# Patient Record
Sex: Female | Born: 1975 | Race: White | Hispanic: No | Marital: Married | Smoking: Never smoker
Health system: Southern US, Community
[De-identification: ages and names within clinical notes are randomized; demographics above are authoritative.]

## PROBLEM LIST (undated history)

## (undated) DIAGNOSIS — E7800 Pure hypercholesterolemia, unspecified: Secondary | ICD-10-CM

---

## 1999-08-07 ENCOUNTER — Ambulatory Visit (HOSPITAL_COMMUNITY): Admission: RE | Admit: 1999-08-07 | Discharge: 1999-08-07 | Payer: Self-pay | Admitting: Obstetrics and Gynecology

## 1999-08-15 ENCOUNTER — Observation Stay (HOSPITAL_COMMUNITY): Admission: AD | Admit: 1999-08-15 | Discharge: 1999-08-16 | Payer: Self-pay | Admitting: Obstetrics and Gynecology

## 1999-09-20 ENCOUNTER — Inpatient Hospital Stay (HOSPITAL_COMMUNITY): Admission: AD | Admit: 1999-09-20 | Discharge: 1999-09-20 | Payer: Self-pay | Admitting: Obstetrics and Gynecology

## 1999-11-14 ENCOUNTER — Inpatient Hospital Stay (HOSPITAL_COMMUNITY): Admission: AD | Admit: 1999-11-14 | Discharge: 1999-11-14 | Payer: Self-pay | Admitting: Obstetrics and Gynecology

## 1999-12-04 ENCOUNTER — Inpatient Hospital Stay (HOSPITAL_COMMUNITY): Admission: AD | Admit: 1999-12-04 | Discharge: 1999-12-04 | Payer: Self-pay | Admitting: Obstetrics and Gynecology

## 1999-12-11 ENCOUNTER — Inpatient Hospital Stay (HOSPITAL_COMMUNITY): Admission: AD | Admit: 1999-12-11 | Discharge: 1999-12-11 | Payer: Self-pay | Admitting: Obstetrics & Gynecology

## 1999-12-16 ENCOUNTER — Inpatient Hospital Stay (HOSPITAL_COMMUNITY): Admission: AD | Admit: 1999-12-16 | Discharge: 1999-12-19 | Payer: Self-pay | Admitting: Obstetrics and Gynecology

## 1999-12-21 ENCOUNTER — Encounter: Admission: RE | Admit: 1999-12-21 | Discharge: 2000-03-20 | Payer: Self-pay | Admitting: Obstetrics and Gynecology

## 2000-01-22 ENCOUNTER — Other Ambulatory Visit: Admission: RE | Admit: 2000-01-22 | Discharge: 2000-01-22 | Payer: Self-pay | Admitting: Obstetrics and Gynecology

## 2000-12-05 ENCOUNTER — Other Ambulatory Visit: Admission: RE | Admit: 2000-12-05 | Discharge: 2000-12-05 | Payer: Self-pay | Admitting: Obstetrics and Gynecology

## 2001-06-23 ENCOUNTER — Encounter: Payer: Self-pay | Admitting: Obstetrics and Gynecology

## 2001-06-23 ENCOUNTER — Ambulatory Visit (HOSPITAL_COMMUNITY): Admission: RE | Admit: 2001-06-23 | Discharge: 2001-06-23 | Payer: Self-pay | Admitting: Obstetrics and Gynecology

## 2001-07-19 ENCOUNTER — Encounter (INDEPENDENT_AMBULATORY_CARE_PROVIDER_SITE_OTHER): Payer: Self-pay

## 2001-07-19 ENCOUNTER — Inpatient Hospital Stay (HOSPITAL_COMMUNITY): Admission: AD | Admit: 2001-07-19 | Discharge: 2001-07-21 | Payer: Self-pay | Admitting: Obstetrics & Gynecology

## 2001-11-27 ENCOUNTER — Encounter: Payer: Self-pay | Admitting: Obstetrics and Gynecology

## 2001-11-27 ENCOUNTER — Ambulatory Visit (HOSPITAL_COMMUNITY): Admission: RE | Admit: 2001-11-27 | Discharge: 2001-11-27 | Payer: Self-pay | Admitting: Obstetrics and Gynecology

## 2004-09-22 ENCOUNTER — Other Ambulatory Visit: Admission: RE | Admit: 2004-09-22 | Discharge: 2004-09-22 | Payer: Self-pay | Admitting: Obstetrics and Gynecology

## 2005-01-09 ENCOUNTER — Ambulatory Visit: Payer: Self-pay | Admitting: *Deleted

## 2005-01-24 ENCOUNTER — Ambulatory Visit: Payer: Self-pay

## 2005-01-30 ENCOUNTER — Ambulatory Visit: Payer: Self-pay | Admitting: *Deleted

## 2005-04-26 ENCOUNTER — Ambulatory Visit: Payer: Self-pay | Admitting: *Deleted

## 2005-05-10 ENCOUNTER — Ambulatory Visit: Payer: Self-pay | Admitting: *Deleted

## 2005-07-10 ENCOUNTER — Ambulatory Visit: Payer: Self-pay | Admitting: *Deleted

## 2008-08-29 ENCOUNTER — Inpatient Hospital Stay (HOSPITAL_COMMUNITY): Admission: AD | Admit: 2008-08-29 | Discharge: 2008-08-29 | Payer: Self-pay | Admitting: Obstetrics and Gynecology

## 2010-07-25 LAB — URINALYSIS, ROUTINE W REFLEX MICROSCOPIC
Glucose, UA: 250 mg/dL — AB
Ketones, ur: 15 mg/dL — AB
Nitrite: POSITIVE — AB
Protein, ur: 100 mg/dL — AB
Specific Gravity, Urine: <1.005 — ABNORMAL LOW (ref 1.005–1.030)
Urobilinogen, UA: >8 mg/dL — ABNORMAL HIGH (ref 0.0–1.0)
pH: 5 (ref 5.0–8.0)

## 2010-07-25 LAB — URINE MICROSCOPIC-ADD ON

## 2010-09-01 NOTE — Op Note (Signed)
[facility] of [facility]  Patient:    Tiffany Summers, Tiffany Summers Visit Number: [id number] MRN: [id number]          Service Type: OBS Location: 910A 9144 01 Attending Physician:  Lars Pinks Dictated by:   Caralyn Guile Arlyce Dice, M.D. Proc. Date: 07/20/01 Admit Date:  07/19/2001                             Operative Report  PREOPERATIVE DIAGNOSES:       Voluntary sterilization.  POSTOPERATIVE DIAGNOSES:      Voluntary sterilization.  PROCEDURE:                    Bilateral partial salpingectomy for sterilization.  SURGEON:                      Richard D. Arlyce Dice, M.D.  ANESTHESIA:                   Epidural.  ESTIMATED BLOOD LOSS:         5 cc.  FINDINGS:                     Normal appearing fallopian tubes.  INDICATIONS:                  This is a 35 year old gravida 2, para 2 who is less than 24 hours post vaginal delivery who requests tubal ligation.  The failure rate of 2-5 per 1000, the fact that the procedure is permanent, and the fact that there are alternative nonpermanent forms of birth control were discussed with the patient prior to proceeding.  PROCEDURE:                    The patient was taken to the operating room where an epidural catheter that had been placed for labor was reinjected for surgical anesthesia.  The abdomen was prepped and draped in a sterile fashion. A subumbilical incision was made and carried down to the fascia by sharp and blunt dissection.  The incision was then extended into the peritoneum by sharp and blunt dissection.  The right fallopian tube was identified, grasped with a Babcock clamp, and traced to the fimbriated end.  The isthmic ampullary junction was then identified and a knuckle of tube was ligated and excised for pathological confirmation.  The identical procedure was then carried out on the contralateral tube.  The fascia was closed with a matris suture and the skin was closed with subcuticular 4-0 Dexon  suture.  The patient tolerated procedure well and left the operating room in good condition. Dictated by:   Caralyn Guile Arlyce Dice, M.D. Attending Physician:  Lars Pinks DD:  07/20/01 TD:  07/21/01 Job: [id number]

## 2010-09-01 NOTE — Discharge Summary (Signed)
[facility]  Patient:    Tiffany Summers, Tiffany Summers Visit Number: [id number] MRN: [id number]          Service Type: OBS Location: 910A 9144 01 Attending Physician:  Lars Pinks Dictated by:   Gerrit Friends. Aldona Bar, M.D. Admit Date:  07/19/2001 Disc. Date: 07/21/01                             Discharge Summary  DISCHARGE DIAGNOSES:          1. Term pregnancy, delivered a 7 pound                                  7 ounce female infant, Apgars 9 and 9.                               2. Blood type O+.                               3. Desire for permanent elective sterilization.  PROCEDURES:                   1. Normal spontaneous delivery.                               2. Second-degree tear and repair.                               3. Postpartum tubal sterilization.  HISTORY:                      This 35 year old gravida 2, para 1 with a due date of April 15 was admitted in labor after an uncomplicated pregnancy. Membranes were ruptured at the time of presentation as well.  She was admitted, placed on intravenous Pitocin, and subsequently had a good labor pattern with a subsequent normal spontaneous delivery of a viable female infant with Apgars of 9 and 9 over a second-degree tear, which was repaired without difficulty.  Baby weight was 7 pounds 7 ounces.  The patient did request a permanent elective sterilization procedure and, according to her wishes, was taken to the operating room on the morning of April 6 for such procedure.  On the morning of April 7, she was ambulating well, tolerating a regular diet well, was having normal bowel and bladder function, was afebrile, was breast feeding without difficulty.  Vital signs were stable.  She was afebrile and she was deemed ready for discharge. Accordingly, she was given office visit instructions per discharge brochure at the time of discharge.  DISCHARGE MEDICATIONS:        1. Vitamins 1 a day.                 2. Ferrous sulfate 300 mg a day.                               3. Motrin 600 mg every six hours as needed for  pain.                               4. Tylox 1-2 every 4-6 hours as needed for                                  severe pain.  LABORATORY DATA:              Discharge hemoglobin was 10.1 with a white count of 13,500 and a platelet count of 168,000.  FOLLOWUP:                     The patient will return to the office for followup in approximately four weekss time or as needed.  CONDITION ON DISCHARGE:       Improved. Dictated by:   Gerrit Friends. Aldona Bar, M.D. Attending Physician:  Lars Pinks DD:  07/21/01 TD:  07/21/01 Job: [id number]

## 2010-09-01 NOTE — Discharge Summary (Signed)
[facility] of [facility]  Patient:    Tiffany Summers, Tiffany Summers                      MRN: [id number] Adm. Date:  [id number] Disc. Date: [id number] Attending:  Conley Simmonds A Dictator:   Leilani Able, P.A.                           Discharge Summary  FINAL DIAGNOSES:              1. Intrauterine pregnancy at 40 and 2/[redacted] weeks                                  gestation.                               2. Nonreassuring fetal assessment.                               3. Maternal exhaustion.  PROCEDURES:                   1. Attempted vacuum extraction vaginal delivery.                               2. Outlet forceps delivery and repair of a fourth                                  degree laceration.  SURGEON:                      Dr. Conley Simmonds.  COMPLICATIONS:                Fourth degree extension of her midline episiotomy.  ADMISSION HISTORY:            This 35 year old G1, P0 presented at 42 and 2/[redacted] weeks gestation with contractions every five to seven minutes.  HOSPITAL COURSE:              The patients cervix upon admission was about 1-2 m dilated, 80% effaced, and -2 station.  The patient had AROM performed at this time with clear fluid, and fetal heart rate was reassuring at this time.  The patients blood pressures started rising at this time and ranged up to about 140/94 at the maximum.  Preeclampsia labs were checked and all noted to be normal.  The patient received Stadol IV and then received an epidural.  The patient progressed rapidly to complete.  At this point there were some mild to moderate variable accelerations noted on the fetal heart tracing.  Fetal scalp electrodes were placed at this time. The patient continued to develop some moderate variable accelerations while pushing.  At this point it was felt that we needed to proceed with a vacuum extraction delivery to shorten the second stage of labor.  At this point the Mityvac was applied and the  vertex was brought down to a +3 station.  At this point an episiotomy had been cut, the suction was failing on the Mighty Vac.  At this  point a decision was made to proceed with forceps delivery  as the patient was unable to spontaneously deliver the vertex.  At this point Simpson forceps were  placed without difficulty, and the patient delivered with one contraction. There was a nuchal cord x 1 noted.  The patient had the delivery of a 7 pound 13 ounce female infant with Apgars and 8 and 8.  There was a fourth degree extension of her episiotomy that was repaired.  The patient tolerated the procedure well.  The patients postpartum course was complicated by some low grade temperatures which  eventually resolved.  The patient was felt ready for discharge on postpartum day #2.  DISCHARGE INSTRUCTIONS:       The patient was sent home on a regular diet, told to decrease activity.  DISCHARGE MEDICATIONS:        Told to continue prenatal vitamin, told to continue stool softeners if needed, was given Tylox one to two q.4h. as needed for pain.  DISCHARGE FOLLOWUP:           Told to follow up in the office in four to six weeks. DD:  01/10/00 TD:  01/10/00 Job: [id number]

## 2010-09-01 NOTE — Op Note (Signed)
[facility]  Patient:    Tiffany Summers, Tiffany Summers                      MRN: [id number] Proc. Date: 12/17/99 Adm. Date:  [id number] Attending:  Conley Simmonds A                           Operative Report  PREOPERATIVE DIAGNOSES:       1. Intrauterine gestation at 40+ 2 weeks.                               2. Nonreassuring fetal assessment.                               3. Maternal exhaustion.  POSTOPERATIVE DIAGNOSES:      1. Intrauterine gestation at 40+ 2 weeks.                               2. Nonreassuring fetal assessment.                               3. Maternal exhaustion.  OPERATION/PROCEDURE:          1. Attempted vacuum extraction vaginal delivery.                               2. Episiotomy.                               3. Outlet forceps delivery.                               4. Repair of fourth degree laceration.  SURGEON:                      Brook A. Edward Jolly, M.D.  ANESTHESIA:                   Epidural, Duramorph epidural.  ESTIMATED BLOOD LOSS:         Less than 500 cc.  COMPLICATIONS:                Fourth degree extension of midline episiotomy.  INDICATIONS FOR PROCEDURE:    The patient is a 35 year old gravida 1 para 0 female at 11+ [redacted] weeks gestation when she presented on December 16, 1999 complaining of contractions every five to seven minutes.  The patient was noted to have cervical dilatation of 1-2 cm, 80% effacement, with vertex at -2 station.  The patient was noted to be in latent phase labor at this time, and artificial rupture of membranes was performed, with clear fluid noted.  The fetus had reassuring fetal assessment at this time.                                Upon presentation, the patient had blood pressures which ranged up to 140/94 at maximum.  Preeclampsia laboratories were all checked and were noted to be negative.  The patient progressed through her latent phase labor, and she received  Stadol intravenously for anesthesia.  She then received an epidural, and she rapidly progressed to complete cervical dilatation with vertex at the +1 station.  The patient had been experiencing mild to moderate variable fetal heart rate decelerations, and a fetal scalp electrode was placed.  The patient continued to develop moderate variable fetal heart rate decelerations while pushing, and these progressed to severe variable decelerations.  The patient had decreased maternal effort secondary to her epidural anesthesia, and recommendation was made by the obstetrician to therefore proceed with vacuum extraction delivery.  DESCRIPTION OF PROCEDURE:     The patients bladder was emptied of all urine, and the fetal vertex was confirmed to be in occiput anterior position at the +2 station.  The Mityvac was applied to the fetal vertex, and the vertex was brought down to a +3 station.  At this point an episiotomy had been cut.  The suction was failing on the Mityvac, and the decision was therefore made to proceed with forceps delivery as the patient was not able to spontaneously deliver the vertex.  Simpson forceps were placed without difficulty, and the vertex was delivered with one contraction.  A nuchal cord x 1 was reduced.                                A vigorous female infant was born at 41 and assigned Apgar scores of 8 at one minute and 9 at five minutes.  Shortly after delivery the newborn developed retractions and was taken to the newborn nursery after already having received blow-by oxygen, bulb syringe suctioning, and removal of mucus.                                The midline episiotomy was noted to extend into a fourth degree laceration.  The placenta was next delivered spontaneously, and it was noted to have a normal insertion of a three vessel cord.  The cervix and vagina were without any lacerations.  The fourth degree laceration was repaired in standard fashion using a  combination of running suture of 3-0 chromic at the mucosal level.  This was supported with a second layer of running 2-0 Vicryl placed over this.  The rectal sphincter was repaired in standard fashion using 0 Vicryl figure-of-eight sutures, and the vaginal incision was closed with 2-0 Vicryl, all under the assistance of a Duramorph epidural.  A Foley catheter was placed at the end of the procedure.  The cord pH was noted to be 7.16.                                The patient tolerated the procedure well.  She received Pitocin 20 units intravenously after delivery of the placenta.  All needle, instrument, and sponge counts were reported as correct. DD:  12/17/99 TD:  12/19/99 Job: [id number]

## 2015-09-28 DIAGNOSIS — M545 Low back pain: Secondary | ICD-10-CM | POA: Diagnosis not present

## 2015-09-28 MED FILL — predniSONE 10 MG (21) TBPK: 10 | 6 days supply | Qty: 21 | Fill #0

## 2016-07-18 DIAGNOSIS — H5213 Myopia, bilateral: Secondary | ICD-10-CM | POA: Diagnosis not present

## 2016-07-18 DIAGNOSIS — H52223 Regular astigmatism, bilateral: Secondary | ICD-10-CM | POA: Diagnosis not present

## 2016-07-31 DIAGNOSIS — Z1231 Encounter for screening mammogram for malignant neoplasm of breast: Secondary | ICD-10-CM | POA: Diagnosis not present

## 2016-07-31 DIAGNOSIS — Z124 Encounter for screening for malignant neoplasm of cervix: Secondary | ICD-10-CM | POA: Diagnosis not present

## 2016-07-31 DIAGNOSIS — Z1151 Encounter for screening for human papillomavirus (HPV): Secondary | ICD-10-CM | POA: Diagnosis not present

## 2016-07-31 DIAGNOSIS — Z1389 Encounter for screening for other disorder: Secondary | ICD-10-CM | POA: Diagnosis not present

## 2016-07-31 DIAGNOSIS — R319 Hematuria, unspecified: Secondary | ICD-10-CM | POA: Diagnosis not present

## 2016-07-31 DIAGNOSIS — Z01419 Encounter for gynecological examination (general) (routine) without abnormal findings: Secondary | ICD-10-CM | POA: Diagnosis not present

## 2016-07-31 DIAGNOSIS — Z6825 Body mass index (BMI) 25.0-25.9, adult: Secondary | ICD-10-CM | POA: Diagnosis not present

## 2016-07-31 DIAGNOSIS — L720 Epidermal cyst: Secondary | ICD-10-CM | POA: Diagnosis not present

## 2016-08-03 DIAGNOSIS — Z0000 Encounter for general adult medical examination without abnormal findings: Secondary | ICD-10-CM | POA: Diagnosis not present

## 2016-08-03 MED FILL — SULFAMETHOXAZOLE/TMP DS TAB: 800-160 | 5 days supply | Qty: 10 | Fill #0

## 2016-10-07 ENCOUNTER — Encounter (HOSPITAL_COMMUNITY): Payer: Self-pay | Admitting: Emergency Medicine

## 2016-10-07 ENCOUNTER — Emergency Department (HOSPITAL_COMMUNITY)
Admission: EM | Admit: 2016-10-07 | Discharge: 2016-10-07 | Disposition: A | Discharge status: Home / Self Care | Payer: 59 | Attending: Emergency Medicine | Admitting: Emergency Medicine

## 2016-10-07 DIAGNOSIS — Y93H1 Activity, digging, shoveling and raking: Secondary | ICD-10-CM | POA: Insufficient documentation

## 2016-10-07 DIAGNOSIS — Y92007 Garden or yard of unspecified non-institutional (private) residence as the place of occurrence of the external cause: Secondary | ICD-10-CM | POA: Insufficient documentation

## 2016-10-07 DIAGNOSIS — Y999 Unspecified external cause status: Secondary | ICD-10-CM | POA: Diagnosis not present

## 2016-10-07 DIAGNOSIS — X503XXA Overexertion from repetitive movements, initial encounter: Secondary | ICD-10-CM | POA: Diagnosis not present

## 2016-10-07 DIAGNOSIS — S39012A Strain of muscle, fascia and tendon of lower back, initial encounter: Secondary | ICD-10-CM | POA: Diagnosis not present

## 2016-10-07 DIAGNOSIS — S3992XA Unspecified injury of lower back, initial encounter: Secondary | ICD-10-CM | POA: Diagnosis present

## 2016-10-07 HISTORY — DX: Pure hypercholesterolemia, unspecified: E78.00

## 2016-10-07 MED ORDER — CYCLOBENZAPRINE HCL 10 MG PO TABS: 10.0000 mg | ORAL_TABLET | Freq: Three times a day (TID) | ORAL | 0 refills | Status: DC | PRN

## 2016-10-07 MED ORDER — MELOXICAM 7.5 MG PO TABS
7.5000 mg | ORAL_TABLET | Freq: Every day | ORAL | 0 refills | Status: DC
Start: 2016-10-07 — End: 2022-11-01

## 2016-10-07 MED ORDER — LIDOCAINE 5 % EX PTCH: 1.0000 | MEDICATED_PATCH | CUTANEOUS | 0 refills | Status: DC

## 2016-10-07 MED ORDER — KETOROLAC TROMETHAMINE 60 MG/2ML IM SOLN
60.0000 mg | Freq: Once | INTRAMUSCULAR | Status: AC
  Administered 2016-10-07: 60 mg via INTRAMUSCULAR
  Filled 2016-10-07: qty 2

## 2016-10-07 MED ORDER — HYDROCODONE-ACETAMINOPHEN 5-325 MG PO TABS: 1.0000 | ORAL_TABLET | ORAL | 0 refills | Status: DC | PRN

## 2016-10-07 NOTE — Discharge Instructions (Signed)
Read the information below.  Use the prescribed medication as directed.  Please discuss all new medications with your pharmacist.  You may return to the Emergency Department at any time for worsening condition or any new symptoms that concern you.    If you develop fevers, loss of control of bowel or bladder, weakness or numbness in your legs, or are unable to walk, return to the ER for a recheck.  °

## 2016-10-07 NOTE — ED Triage Notes (Signed)
Pt reports lower midline back pain after raking on Friday. Ambulatory.

## 2016-10-07 NOTE — ED Provider Notes (Signed)
[facility]-EMERGENCY DEPT Provider Note   CSN: [id number] Arrival date & time: 10/07/16  1219  By signing my name below, I, Cynda AcresHailei Fulton, attest that this documentation has been prepared under the direction and in the presence of [facility] Naples[facility]ily Aurora Rody, PA-C. Electronically Signed: Cynda AcresHailei Fulton, Scribe. 10/07/16. 1:12 PM.  History   Chief Complaint Chief Complaint  Patient presents with  . Back Pain   HPI Comments: Tiffany Summers is a 41 y.o. female with a history of hyperlipidemia, who presents to the Emergency Department complaining of sudden-onset, constant lower back pain that began 2 days ago. Patient states she was raking dirt two days ago when she twisted, injuring her mid lower back with radiation to the buttocks. No additional symptoms noted. Patient reports taking Toradol, which was prescribed to her husband with some relief. Patient is ambulatory in the emergency department. Patient states her pain is worse with twisting or changing positions. Patient denies any urinary retention, dysuria, loss of bowel/bladder control, abdominal pain, or any additional symptoms.   LMP started today, on time and normal.  Denies possibility of pregnancy.    The history is provided by the patient. No language interpreter was used.    Past Medical History:  Diagnosis Date  . Hypercholesteremia     There are no active problems to display for this patient.   History reviewed. No pertinent surgical history.  OB History    No data available       Home Medications    Prior to Admission medications   Medication Sig Start Date End Date Taking? Authorizing Provider  cyclobenzaprine (FLEXERIL) 10 MG tablet Take 1 tablet (10 mg total) by mouth 3 (three) times daily as needed for muscle spasms (or pain). 10/07/16   Trixie DredgeWest, Shamina Etheridge, PA-C  HYDROcodone-acetaminophen (NORCO/VICODIN) 5-325 MG tablet Take 1 tablet by mouth every 4 (four) hours as needed for severe pain. 10/07/16   Trixie DredgeWest, Srihari Shellhammer, PA-C  lidocaine  (LIDODERM) 5 % Place 1 patch onto the skin daily. Remove & Discard patch within 12 hours or as directed by MD 10/07/16   Trixie DredgeWest, Hermine Feria, PA-C  meloxicam (MOBIC) 7.5 MG tablet Take 1-2 tablets (7.5-15 mg total) by mouth daily. 10/07/16   Trixie DredgeWest, Belen Zwahlen, PA-C    Family History History reviewed. No pertinent family history.  Social History Social History  Substance Use Topics  . Smoking status: Never Smoker  . Smokeless tobacco: Never Used  . Alcohol use No     Allergies   Patient has no known allergies.   Review of Systems Review of Systems  Constitutional: Negative for chills and fever.  Genitourinary: Negative for difficulty urinating and dysuria.  Musculoskeletal: Positive for back pain (with radiation to the buttocks). Negative for arthralgias, gait problem and joint swelling.     Physical Exam Updated Vital Signs BP 115/73 (BP Location: Left Arm)   Pulse 69   Temp 98 F (36.7 C) (Oral)   Resp 18   SpO2 100%   Physical Exam  Constitutional: She appears well-developed and well-nourished. No distress.  HENT:  Head: Normocephalic and atraumatic.  Neck: Neck supple.  Pulmonary/Chest: Effort normal.  Abdominal: Soft. She exhibits no distension and no mass. There is no tenderness. There is no rebound and no guarding.  Musculoskeletal:  Mild tenderness to the lower lumbar and right paraspinal musculature. Pain, but no radiation with straight leg raise.   Neurological: She is alert.  Skin: She is not diaphoretic.  Nursing note and vitals reviewed.    ED Treatments /  Results  DIAGNOSTIC STUDIES: Oxygen Saturation is 100% on RA, normal by my interpretation.    COORDINATION OF CARE: 1:12 PM Discussed treatment plan with pt at bedside and pt agreed to plan, which includes pain medication and imaging.   Labs (all labs ordered are listed, but only abnormal results are displayed) Labs Reviewed - No data to display  EKG  EKG Interpretation None       Radiology No  results found.  Procedures Procedures (including critical care time)  Medications Ordered in ED Medications  ketorolac (TORADOL) injection 60 mg (60 mg Intramuscular Given 10/07/16 1322)     Initial Impression / Assessment and Plan / ED Course  I have reviewed the triage vital signs and the nursing notes.  Pertinent labs & imaging results that were available during my care of the patient were reviewed by me and considered in my medical decision making (see chart for details).      Patient with back pain.  No neurological deficits and normal neuro exam.  Patient can walk but states is painful.  No loss of bowel or bladder control.  No concern for cauda equina. RICE protocol and pain medicine indicated and discussed with patient.    Afebrile, nontoxic patient with mechanical low back pain. No red flags.  D/C home with PCP follow up, symptomatic medications.  Discussed result, findings, treatment, and follow up  with patient.  Pt given return precautions.  Pt verbalizes understanding and agrees with plan.       Final Clinical Impressions(s) / ED Diagnoses   Final diagnoses:  Strain of lumbar region, initial encounter    New Prescriptions Discharge Medication List as of 10/07/2016  1:20 PM    START taking these medications   Details  cyclobenzaprine (FLEXERIL) 10 MG tablet Take 1 tablet (10 mg total) by mouth 3 (three) times daily as needed for muscle spasms (or pain)., Starting Sun 10/07/2016, Print    HYDROcodone-acetaminophen (NORCO/VICODIN) 5-325 MG tablet Take 1 tablet by mouth every 4 (four) hours as needed for severe pain., Starting Sun 10/07/2016, Print    lidocaine (LIDODERM) 5 % Place 1 patch onto the skin daily. Remove & Discard patch within 12 hours or as directed by MD, Starting Sun 10/07/2016, Print    meloxicam (MOBIC) 7.5 MG tablet Take 1-2 tablets (7.5-15 mg total) by mouth daily., Starting Sun 10/07/2016, Print      I personally performed the services described in  this documentation, which was scribed in my presence. The recorded information has been reviewed and is accurate.     Trixie Dredge, PA-C 10/07/16 1632    Arby Barrette, MD 10/26/16 [phone]

## 2016-10-08 DIAGNOSIS — M5442 Lumbago with sciatica, left side: Secondary | ICD-10-CM | POA: Diagnosis not present

## 2016-10-11 ENCOUNTER — Ambulatory Visit
Admission: RE | Admit: 2016-10-11 | Discharge: 2016-10-11 | Disposition: A | Discharge status: Home / Self Care | Payer: 59 | Source: Ambulatory Visit | Attending: Family Medicine | Admitting: Family Medicine

## 2016-10-11 ENCOUNTER — Other Ambulatory Visit: Payer: Self-pay | Admitting: Family Medicine

## 2016-10-11 DIAGNOSIS — M545 Low back pain: Secondary | ICD-10-CM | POA: Diagnosis not present

## 2016-10-11 DIAGNOSIS — M5442 Lumbago with sciatica, left side: Secondary | ICD-10-CM

## 2016-10-19 DIAGNOSIS — M5442 Lumbago with sciatica, left side: Secondary | ICD-10-CM | POA: Diagnosis not present

## 2016-10-19 MED FILL — CYCLOBENZAPRINE 10 MG TABLE: 10 | 5 days supply | Qty: 15 | Fill #0

## 2016-10-19 MED FILL — HYDROCODON-APAP 5-325: 5-325 | 2 days supply | Qty: 8 | Fill #0

## 2016-10-19 MED FILL — NAPROXEN 500 MG TABLET: 500 | 15 days supply | Qty: 30 | Fill #0

## 2016-10-22 ENCOUNTER — Other Ambulatory Visit: Payer: Self-pay | Admitting: Family Medicine

## 2016-10-22 DIAGNOSIS — R29898 Other symptoms and signs involving the musculoskeletal system: Secondary | ICD-10-CM

## 2016-10-22 DIAGNOSIS — M5442 Lumbago with sciatica, left side: Secondary | ICD-10-CM

## 2016-10-22 DIAGNOSIS — R200 Anesthesia of skin: Secondary | ICD-10-CM

## 2016-10-26 ENCOUNTER — Other Ambulatory Visit (HOSPITAL_COMMUNITY): Payer: Self-pay | Admitting: Family Medicine

## 2016-10-26 ENCOUNTER — Other Ambulatory Visit: Payer: Self-pay | Admitting: Family Medicine

## 2016-10-26 DIAGNOSIS — M5442 Lumbago with sciatica, left side: Secondary | ICD-10-CM

## 2016-10-26 DIAGNOSIS — R200 Anesthesia of skin: Secondary | ICD-10-CM

## 2016-10-26 DIAGNOSIS — R29898 Other symptoms and signs involving the musculoskeletal system: Secondary | ICD-10-CM

## 2016-10-27 ENCOUNTER — Ambulatory Visit (HOSPITAL_COMMUNITY)
Admission: RE | Admit: 2016-10-27 | Discharge: 2016-10-27 | Disposition: A | Discharge status: Home / Self Care | Payer: 59 | Source: Ambulatory Visit | Attending: Family Medicine | Admitting: Family Medicine

## 2016-10-27 DIAGNOSIS — M545 Low back pain: Secondary | ICD-10-CM | POA: Diagnosis present

## 2016-10-27 DIAGNOSIS — M48061 Spinal stenosis, lumbar region without neurogenic claudication: Secondary | ICD-10-CM | POA: Diagnosis not present

## 2016-10-27 DIAGNOSIS — M5126 Other intervertebral disc displacement, lumbar region: Secondary | ICD-10-CM | POA: Diagnosis not present

## 2016-10-27 DIAGNOSIS — R29898 Other symptoms and signs involving the musculoskeletal system: Secondary | ICD-10-CM

## 2016-10-27 DIAGNOSIS — K5730 Diverticulosis of large intestine without perforation or abscess without bleeding: Secondary | ICD-10-CM | POA: Insufficient documentation

## 2016-10-27 DIAGNOSIS — R531 Weakness: Secondary | ICD-10-CM | POA: Insufficient documentation

## 2016-10-27 DIAGNOSIS — M5442 Lumbago with sciatica, left side: Secondary | ICD-10-CM

## 2016-10-27 DIAGNOSIS — R200 Anesthesia of skin: Secondary | ICD-10-CM | POA: Insufficient documentation

## 2016-10-27 DIAGNOSIS — M47816 Spondylosis without myelopathy or radiculopathy, lumbar region: Secondary | ICD-10-CM | POA: Diagnosis not present

## 2016-10-27 DIAGNOSIS — M5136 Other intervertebral disc degeneration, lumbar region: Secondary | ICD-10-CM | POA: Diagnosis not present

## 2017-09-18 DIAGNOSIS — Z6826 Body mass index (BMI) 26.0-26.9, adult: Secondary | ICD-10-CM | POA: Diagnosis not present

## 2017-09-18 DIAGNOSIS — Z1231 Encounter for screening mammogram for malignant neoplasm of breast: Secondary | ICD-10-CM | POA: Diagnosis not present

## 2017-09-18 DIAGNOSIS — Z130 Encounter for screening for diseases of the blood and blood-forming organs and certain disorders involving the immune mechanism: Secondary | ICD-10-CM | POA: Diagnosis not present

## 2017-09-18 DIAGNOSIS — Z1389 Encounter for screening for other disorder: Secondary | ICD-10-CM | POA: Diagnosis not present

## 2017-09-18 DIAGNOSIS — Z01419 Encounter for gynecological examination (general) (routine) without abnormal findings: Secondary | ICD-10-CM | POA: Diagnosis not present

## 2017-09-18 DIAGNOSIS — Z124 Encounter for screening for malignant neoplasm of cervix: Secondary | ICD-10-CM | POA: Diagnosis not present

## 2018-12-09 DIAGNOSIS — Z1389 Encounter for screening for other disorder: Secondary | ICD-10-CM | POA: Diagnosis not present

## 2018-12-09 DIAGNOSIS — Z130 Encounter for screening for diseases of the blood and blood-forming organs and certain disorders involving the immune mechanism: Secondary | ICD-10-CM | POA: Diagnosis not present

## 2018-12-09 DIAGNOSIS — Z01419 Encounter for gynecological examination (general) (routine) without abnormal findings: Secondary | ICD-10-CM | POA: Diagnosis not present

## 2018-12-09 DIAGNOSIS — Z6823 Body mass index (BMI) 23.0-23.9, adult: Secondary | ICD-10-CM | POA: Diagnosis not present

## 2018-12-09 DIAGNOSIS — Z0000 Encounter for general adult medical examination without abnormal findings: Secondary | ICD-10-CM | POA: Diagnosis not present

## 2018-12-09 DIAGNOSIS — Z1231 Encounter for screening mammogram for malignant neoplasm of breast: Secondary | ICD-10-CM | POA: Diagnosis not present

## 2018-12-11 DIAGNOSIS — Z0000 Encounter for general adult medical examination without abnormal findings: Secondary | ICD-10-CM | POA: Diagnosis not present

## 2019-02-09 DIAGNOSIS — R1013 Epigastric pain: Secondary | ICD-10-CM | POA: Diagnosis not present

## 2019-02-09 MED FILL — ONDANSETRON HCL 4 MG TABLET: 4 | 5 days supply | Qty: 15 | Fill #0

## 2019-02-10 ENCOUNTER — Other Ambulatory Visit: Payer: Self-pay | Admitting: Family Medicine

## 2019-02-10 DIAGNOSIS — R1013 Epigastric pain: Secondary | ICD-10-CM

## 2019-02-11 ENCOUNTER — Ambulatory Visit
Admission: RE | Admit: 2019-02-11 | Discharge: 2019-02-11 | Disposition: A | Discharge status: Home / Self Care | Payer: 59 | Source: Ambulatory Visit | Attending: Family Medicine | Admitting: Family Medicine

## 2019-02-11 ENCOUNTER — Other Ambulatory Visit: Payer: Self-pay | Admitting: Family Medicine

## 2019-02-11 DIAGNOSIS — R1011 Right upper quadrant pain: Secondary | ICD-10-CM | POA: Diagnosis not present

## 2019-02-11 DIAGNOSIS — R1013 Epigastric pain: Secondary | ICD-10-CM | POA: Diagnosis not present

## 2019-06-18 MED FILL — FLUCONAZOLE 150 MG TABS: 150 | 1 days supply | Qty: 1 | Fill #0

## 2019-07-15 DIAGNOSIS — M533 Sacrococcygeal disorders, not elsewhere classified: Secondary | ICD-10-CM | POA: Diagnosis not present

## 2019-07-15 MED FILL — MELOXICAM 15 MG TABLET: 15 | 30 days supply | Qty: 30 | Fill #0

## 2019-07-22 DIAGNOSIS — M533 Sacrococcygeal disorders, not elsewhere classified: Secondary | ICD-10-CM | POA: Diagnosis not present

## 2019-09-21 DIAGNOSIS — B379 Candidiasis, unspecified: Secondary | ICD-10-CM | POA: Diagnosis not present

## 2019-09-21 DIAGNOSIS — M533 Sacrococcygeal disorders, not elsewhere classified: Secondary | ICD-10-CM | POA: Diagnosis not present

## 2019-09-22 ENCOUNTER — Other Ambulatory Visit: Payer: Self-pay

## 2019-09-22 ENCOUNTER — Other Ambulatory Visit: Payer: Self-pay | Admitting: Family Medicine

## 2019-09-22 ENCOUNTER — Ambulatory Visit
Admission: RE | Admit: 2019-09-22 | Discharge: 2019-09-22 | Disposition: A | Discharge status: Home / Self Care | Payer: 59 | Source: Ambulatory Visit | Attending: Family Medicine | Admitting: Family Medicine

## 2019-09-22 DIAGNOSIS — M533 Sacrococcygeal disorders, not elsewhere classified: Secondary | ICD-10-CM

## 2019-10-28 DIAGNOSIS — M533 Sacrococcygeal disorders, not elsewhere classified: Secondary | ICD-10-CM | POA: Diagnosis not present

## 2019-10-28 DIAGNOSIS — M545 Low back pain: Secondary | ICD-10-CM | POA: Diagnosis not present

## 2019-10-28 MED FILL — predniSONE 10 MG (48) TBPK: 10 | 12 days supply | Qty: 48 | Fill #0

## 2020-04-28 ENCOUNTER — Other Ambulatory Visit (HOSPITAL_COMMUNITY): Payer: Self-pay

## 2020-04-28 MED FILL — AMOX-CLAV 500-125 MG TABLET: 500-125 | 7 days supply | Qty: 14 | Fill #0

## 2020-06-09 ENCOUNTER — Other Ambulatory Visit (HOSPITAL_COMMUNITY): Payer: Self-pay | Admitting: Family Medicine

## 2020-06-09 MED FILL — FLUCONAZOLE 150 MG TABS: 150 | 1 days supply | Qty: 1 | Fill #0

## 2021-01-18 ENCOUNTER — Other Ambulatory Visit (HOSPITAL_COMMUNITY): Payer: Self-pay

## 2021-01-18 MED ORDER — FLUCONAZOLE 150 MG PO TABS
150.0000 mg | ORAL_TABLET | Freq: Every day | ORAL | 0 refills | Status: DC
  Filled 2021-01-18: qty 1, 1d supply, fill #0

## 2021-01-24 DIAGNOSIS — R300 Dysuria: Secondary | ICD-10-CM | POA: Diagnosis not present

## 2021-02-20 ENCOUNTER — Other Ambulatory Visit (HOSPITAL_COMMUNITY): Payer: Self-pay

## 2021-02-20 DIAGNOSIS — M545 Low back pain, unspecified: Secondary | ICD-10-CM | POA: Diagnosis not present

## 2021-02-20 MED ORDER — CYCLOBENZAPRINE HCL 10 MG PO TABS
ORAL_TABLET | ORAL | 1 refills | Status: DC
  Filled 2021-02-20: qty 20, 20d supply, fill #0

## 2021-02-20 MED ORDER — TRAMADOL HCL 50 MG PO TABS
ORAL_TABLET | ORAL | 1 refills | Status: DC
  Filled 2021-02-20: qty 30, 4d supply, fill #0

## 2021-02-20 MED ORDER — PREDNISONE 20 MG PO TABS
ORAL_TABLET | ORAL | 0 refills | Status: DC
  Filled 2021-02-20: qty 18, 9d supply, fill #0

## 2021-06-30 ENCOUNTER — Telehealth: Payer: 59 | Admitting: Physician Assistant

## 2021-06-30 DIAGNOSIS — B3731 Acute candidiasis of vulva and vagina: Secondary | ICD-10-CM

## 2021-06-30 MED ORDER — FLUCONAZOLE 150 MG PO TABS: 150.0000 mg | ORAL_TABLET | Freq: Once | ORAL | 0 refills | Status: AC

## 2021-06-30 NOTE — Progress Notes (Signed)

## 2021-07-05 ENCOUNTER — Telehealth: Payer: 59 | Admitting: Family

## 2021-07-05 DIAGNOSIS — B3731 Acute candidiasis of vulva and vagina: Secondary | ICD-10-CM

## 2021-07-05 MED ORDER — FLUCONAZOLE 150 MG PO TABS: 150.0000 mg | ORAL_TABLET | ORAL | 0 refills | Status: DC | PRN

## 2021-07-05 NOTE — Progress Notes (Signed)
E-Visit for Vaginal Symptoms ? ?We are sorry that you are not feeling well. Here is how we plan to help! ?Based on what you shared with me it looks like you: May have a yeast vaginosis ? ?Vaginosis is an inflammation of the vagina that can result in discharge, itching and pain. The cause is usually a change in the normal balance of vaginal bacteria or an infection. Vaginosis can also result from reduced estrogen levels after menopause. ? ?The most common causes of vaginosis are: ? ? Bacterial vaginosis which results from an overgrowth of one on several organisms that are normally present in your vagina. ? ? Yeast infections which are caused by a naturally occurring fungus called candida. ? ? Vaginal atrophy (atrophic vaginosis) which results from the thinning of the vagina from reduced estrogen levels after menopause. ? ? Trichomoniasis which is caused by a parasite and is commonly transmitted by sexual intercourse. ? ?Factors that increase your risk of developing vaginosis include: ?Medications, such as antibiotics and steroids ?Uncontrolled diabetes ?Use of hygiene products such as bubble bath, vaginal spray or vaginal deodorant ?Douching ?Wearing damp or tight-fitting clothing ?Using an intrauterine device (IUD) for birth control ?Hormonal changes, such as those associated with pregnancy, birth control pills or menopause ?Sexual activity ?Having a sexually transmitted infection ? ?Your treatment plan is A single Diflucan (fluconazole) 150mg  tablet once.  I have electronically sent this prescription into the pharmacy that you have chosen. ? ?If this does not improve, you will need to be seen in person for further testing.  ? ?Be sure to take all of the medication as directed. Stop taking any medication if you develop a rash, tongue swelling or shortness of breath. Mothers who are breast feeding should consider pumping and discarding their breast milk while on these antibiotics. However, there is no consensus that  infant exposure at these doses would be harmful.  ?Remember that medication creams can weaken latex condoms. ?. ? ? ?HOME CARE: ? ?Good hygiene may prevent some types of vaginosis from recurring and may relieve some symptoms: ? ?Avoid baths, hot tubs and whirlpool spas. Rinse soap from your outer genital area after a shower, and dry the area well to prevent irritation. Don't use scented or harsh soaps, such as those with deodorant or antibacterial action. ?Avoid irritants. These include scented tampons and pads. ?Wipe from front to back after using the toilet. Doing so avoids spreading fecal bacteria to your vagina. ? ?Other things that may help prevent vaginosis include: ? ?Don't douche. Your vagina doesn't require cleansing other than normal bathing. Repetitive douching disrupts the normal organisms that reside in the vagina and can actually increase your risk of vaginal infection. Douching won't clear up a vaginal infection. ?Use a latex condom. Both female and female latex condoms may help you avoid infections spread by sexual contact. ?Wear cotton underwear. Also wear pantyhose with a cotton crotch. If you feel comfortable without it, skip wearing underwear to bed. Yeast thrives in moist environments ?Your symptoms should improve in the next day or two. ? ?GET HELP RIGHT AWAY IF: ? ?You have pain in your lower abdomen ( pelvic area or over your ovaries) ?You develop nausea or vomiting ?You develop a fever ?Your discharge changes or worsens ?You have persistent pain with intercourse ?You develop shortness of breath, a rapid pulse, or you faint. ? ?These symptoms could be signs of problems or infections that need to be evaluated by a medical provider now. ? ?MAKE SURE YOU  ? ?  Understand these instructions. ?Will watch your condition. ?Will get help right away if you are not doing well or get worse. ? ?Thank you for choosing an e-visit. ? ?Your e-visit answers were reviewed by a board certified advanced clinical  practitioner to complete your personal care plan. Depending upon the condition, your plan could have included both over the counter or prescription medications. ? ?Please review your pharmacy choice. Make sure the pharmacy is open so you can pick up prescription now. If there is a problem, you may contact your provider through [employer] and have the prescription routed to another pharmacy.  Your safety is important to [place]. If you have drug allergies check your prescription carefully.  ? ?For the next 24 hours you can use MyChart to ask questions about today's visit, request a non-urgent call back, or ask for a work or school excuse. ?You will get an email in the next two days asking about your experience. I hope that your e-visit has been valuable and will speed your recovery. ? ?Approximately 5 minutes was spent documenting and reviewing patient's chart.  ? ? ?

## 2021-09-06 DIAGNOSIS — M25571 Pain in right ankle and joints of right foot: Secondary | ICD-10-CM | POA: Diagnosis not present

## 2021-09-06 DIAGNOSIS — S93491A Sprain of other ligament of right ankle, initial encounter: Secondary | ICD-10-CM | POA: Diagnosis not present

## 2021-09-22 DIAGNOSIS — S93491A Sprain of other ligament of right ankle, initial encounter: Secondary | ICD-10-CM | POA: Diagnosis not present

## 2021-09-24 ENCOUNTER — Telehealth: Payer: 59 | Admitting: Family

## 2021-09-24 DIAGNOSIS — B3731 Acute candidiasis of vulva and vagina: Secondary | ICD-10-CM

## 2021-09-24 MED ORDER — FLUCONAZOLE 150 MG PO TABS: 150.0000 mg | ORAL_TABLET | ORAL | 0 refills | Status: DC | PRN

## 2021-09-24 NOTE — Progress Notes (Unsigned)

## 2021-10-10 ENCOUNTER — Other Ambulatory Visit (HOSPITAL_COMMUNITY): Payer: Self-pay

## 2021-10-10 ENCOUNTER — Telehealth: Payer: 59 | Admitting: Physician Assistant

## 2021-10-10 DIAGNOSIS — R3989 Other symptoms and signs involving the genitourinary system: Secondary | ICD-10-CM | POA: Diagnosis not present

## 2021-10-10 MED ORDER — CEPHALEXIN 500 MG PO CAPS
500.0000 mg | ORAL_CAPSULE | Freq: Two times a day (BID) | ORAL | 0 refills | Status: AC
  Filled 2021-10-10: qty 14, 7d supply, fill #0

## 2021-10-10 MED ORDER — FLUCONAZOLE 150 MG PO TABS
150.0000 mg | ORAL_TABLET | Freq: Once | ORAL | 0 refills | Status: AC
  Filled 2021-10-10: qty 1, 1d supply, fill #0

## 2021-10-10 NOTE — Progress Notes (Signed)

## 2021-10-15 IMAGING — US US ABDOMEN LIMITED
1 series · 14 of 25 positions shown · non-contrast
Comparison: None.

CLINICAL DATA: Epigastric and right upper quadrant pain

EXAM:
ULTRASOUND ABDOMEN LIMITED RIGHT UPPER QUADRANT

[Series 1: us abdomen limited · 0.18mm/px · 14 of 37 slices shown]
[im 1/37]
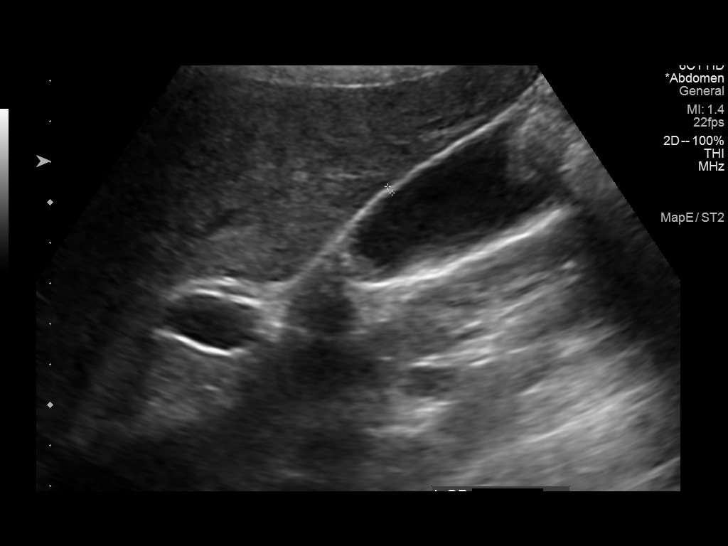
[im 4/37]
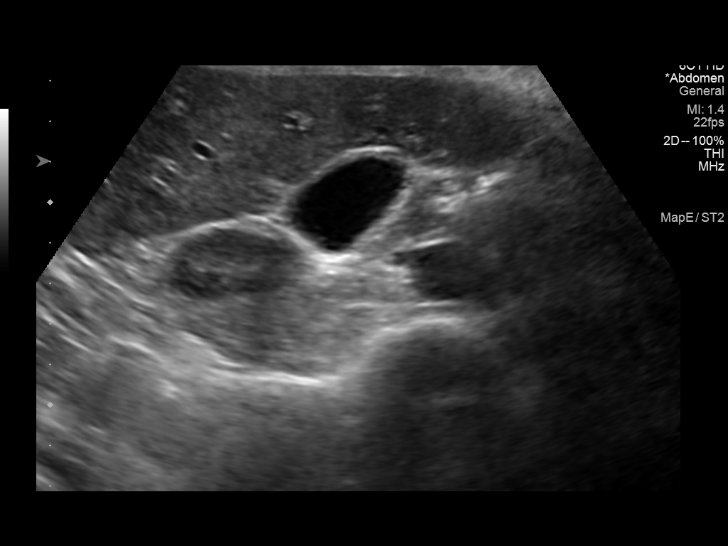
[im 7/37]
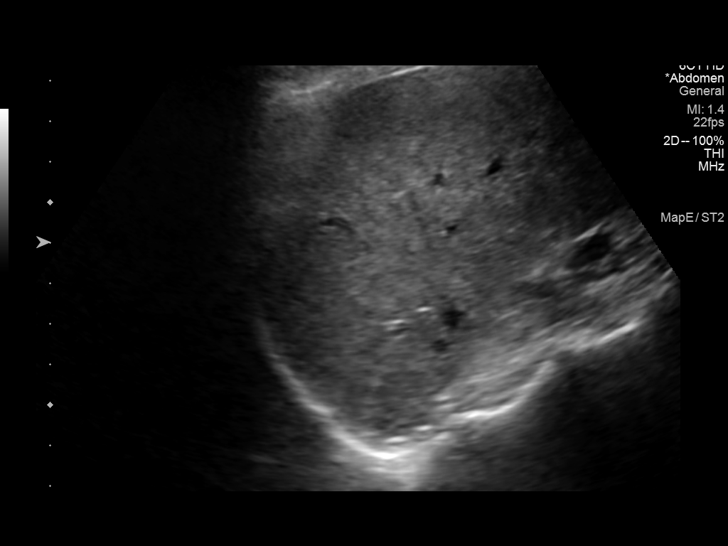
[im 10/37]
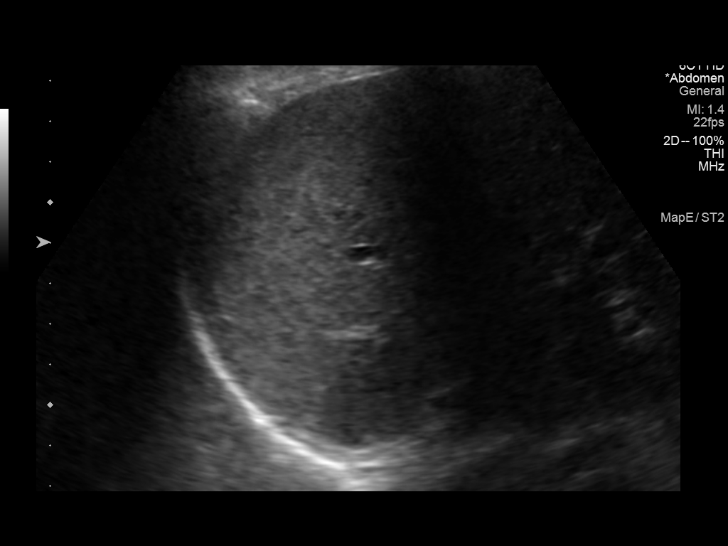
[im 13/37]
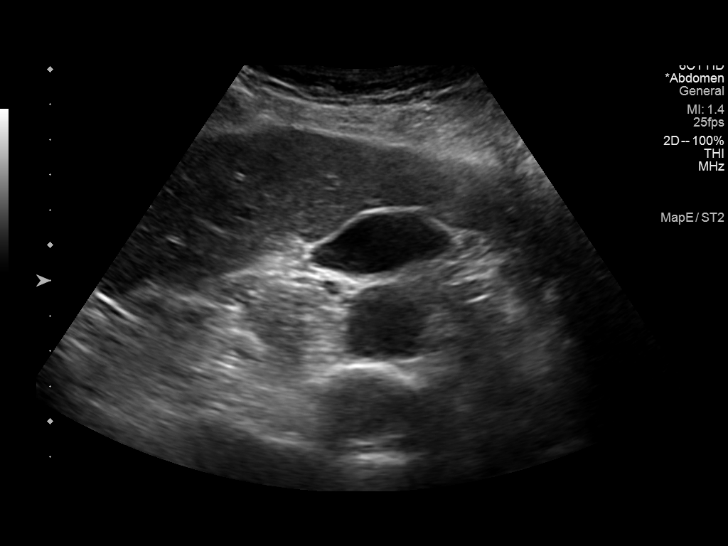
[im 14/37]
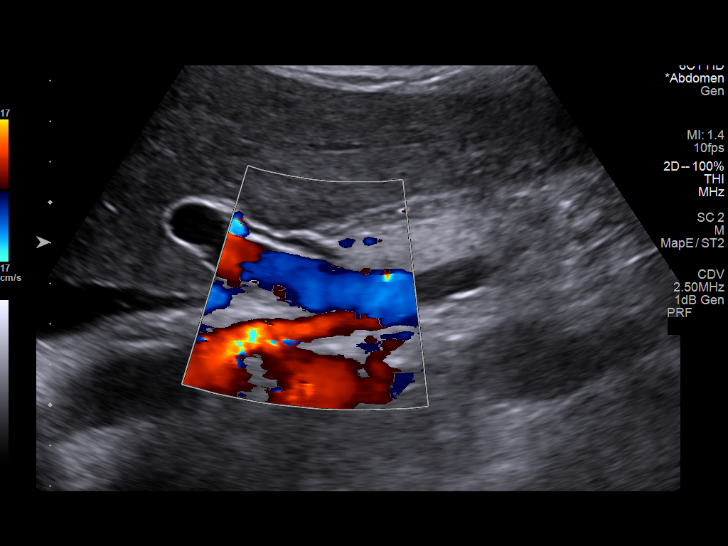
[im 17/37]
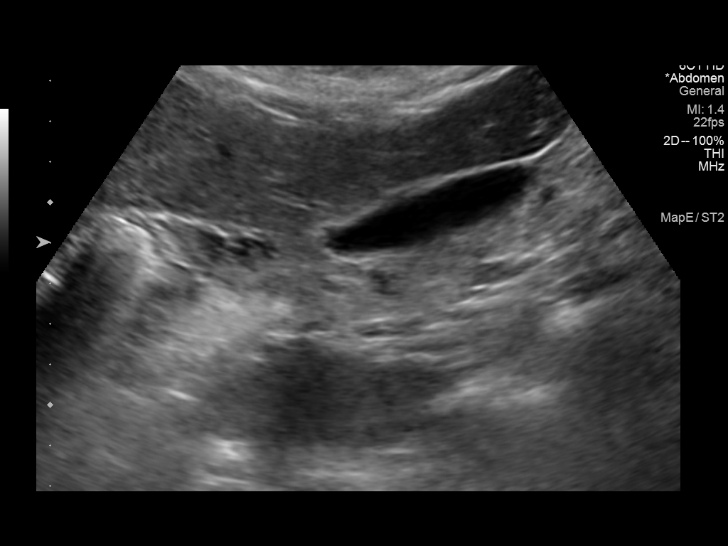
[im 20/37]
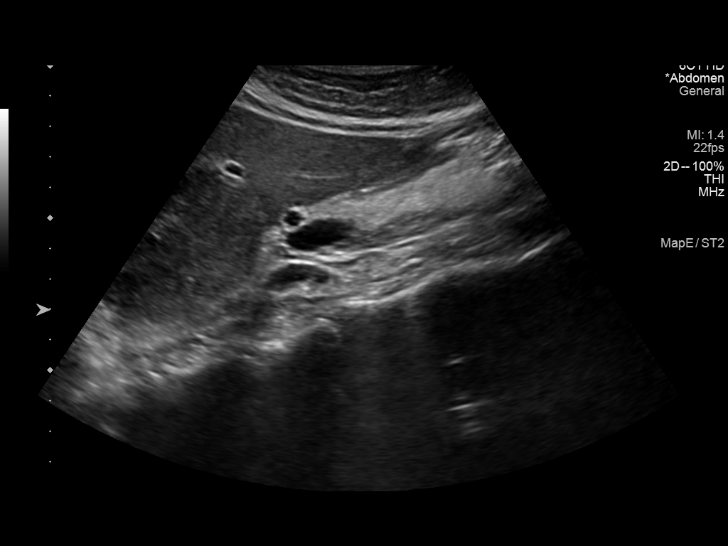
[im 23/37]
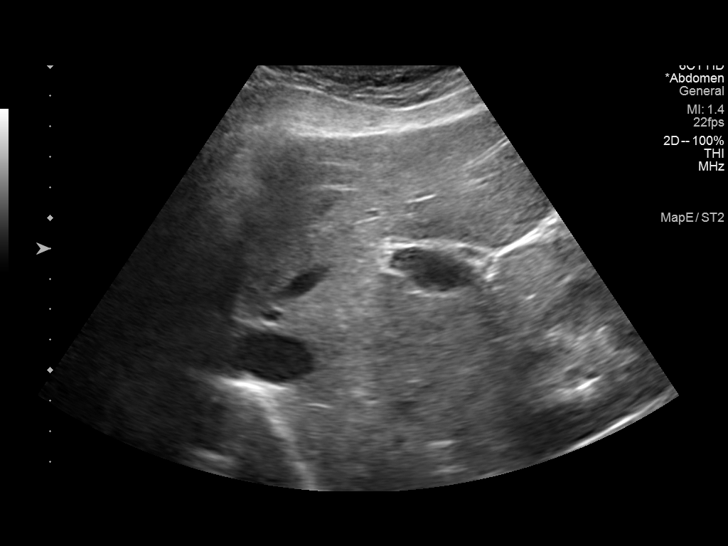
[im 25/37]
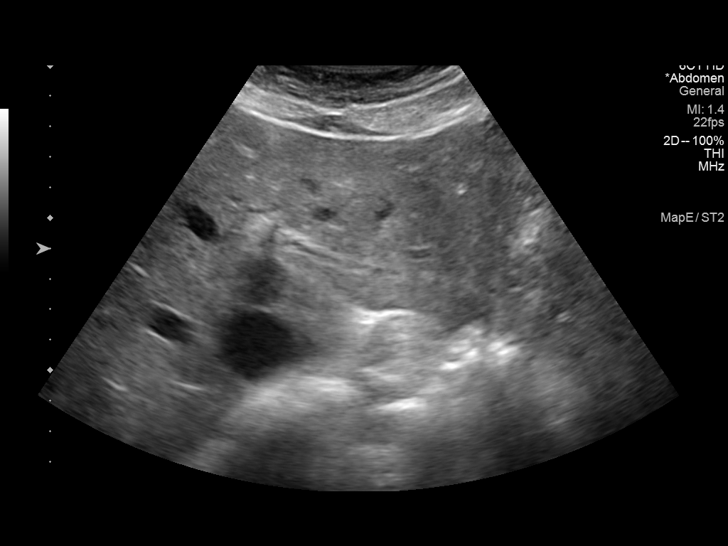
[im 28/37]
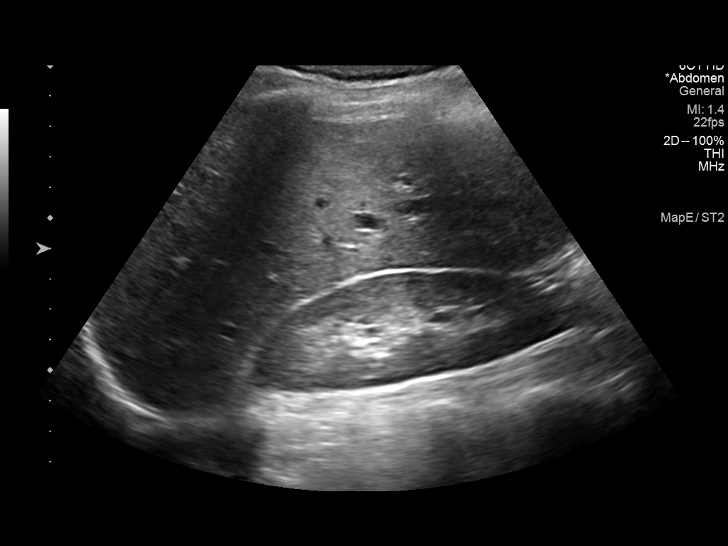
[im 31/37]
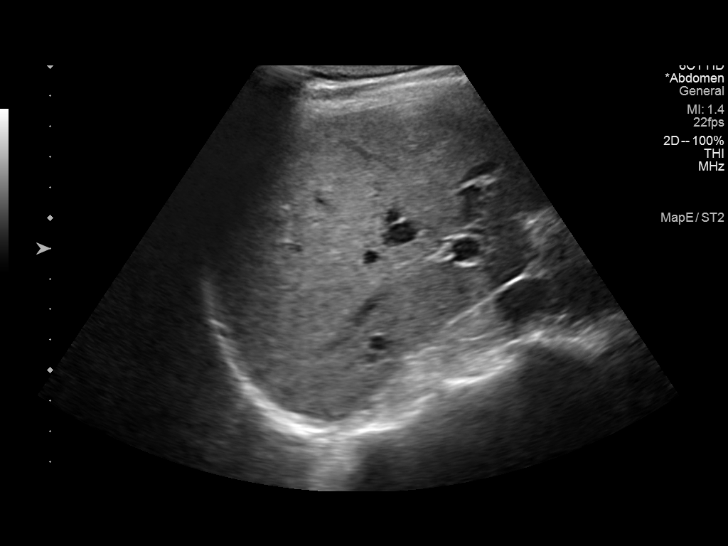
[im 34/37]
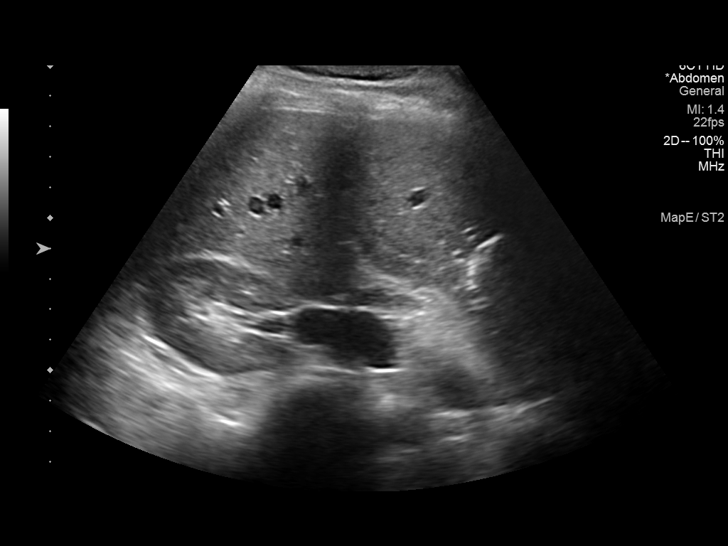
[im 37/37]
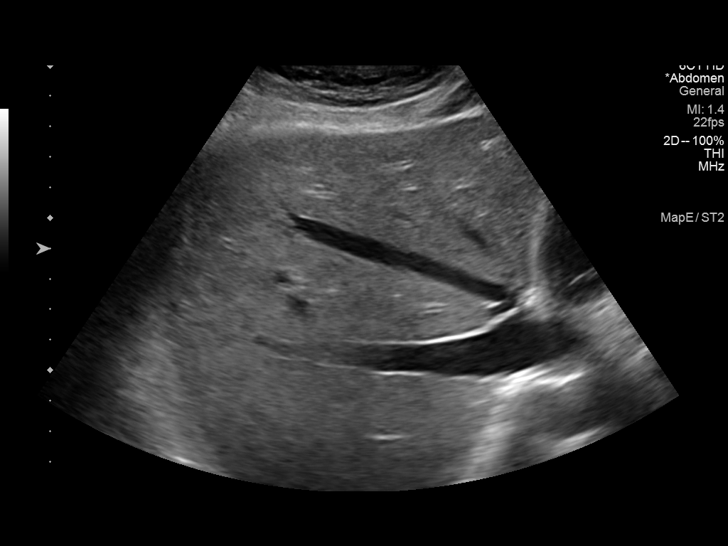

[14 of 25 positions shown; findings below may reference images not displayed]

FINDINGS: Gallbladder:

No gallstones or wall thickening visualized. No sonographic Murphy
sign noted by sonographer.

Common bile duct:

Diameter: 2 mm

Liver:

No focal lesion identified. Within normal limits in parenchymal
echogenicity. Portal vein is patent on color Doppler imaging with
normal direction of blood flow towards the liver.

Other: None.
IMPRESSION: Unremarkable exam. No findings to explain the patient's history of
pain.

## 2021-10-20 DIAGNOSIS — S93491A Sprain of other ligament of right ankle, initial encounter: Secondary | ICD-10-CM | POA: Diagnosis not present

## 2022-03-21 ENCOUNTER — Other Ambulatory Visit (HOSPITAL_COMMUNITY): Payer: Self-pay

## 2022-03-21 ENCOUNTER — Telehealth: Payer: 59 | Admitting: Physician Assistant

## 2022-03-21 DIAGNOSIS — T3695XA Adverse effect of unspecified systemic antibiotic, initial encounter: Secondary | ICD-10-CM

## 2022-03-21 DIAGNOSIS — R3989 Other symptoms and signs involving the genitourinary system: Secondary | ICD-10-CM | POA: Diagnosis not present

## 2022-03-21 DIAGNOSIS — B379 Candidiasis, unspecified: Secondary | ICD-10-CM | POA: Diagnosis not present

## 2022-03-21 MED ORDER — FLUCONAZOLE 150 MG PO TABS
150.0000 mg | ORAL_TABLET | Freq: Once | ORAL | 0 refills | Status: AC
  Filled 2022-03-21: qty 1, 1d supply, fill #0

## 2022-03-21 MED ORDER — CEPHALEXIN 500 MG PO CAPS
500.0000 mg | ORAL_CAPSULE | Freq: Two times a day (BID) | ORAL | 0 refills | Status: AC
  Filled 2022-03-21: qty 14, 7d supply, fill #0

## 2022-03-21 NOTE — Progress Notes (Signed)
E-Visit for Urinary Problems  We are sorry that you are not feeling well.  Here is how we plan to help!  Based on what you shared with me it looks like you most likely have a simple urinary tract infection.  A UTI (Urinary Tract Infection) is a bacterial infection of the bladder.  Most cases of urinary tract infections are simple to treat but a key part of your care is to encourage you to drink plenty of fluids and watch your symptoms carefully.  I have prescribed Keflex 500 mg twice a day for 7 days.  Your symptoms should gradually improve. Call us if the burning in your urine worsens, you develop worsening fever, back pain or pelvic pain or if your symptoms do not resolve after completing the antibiotic.  I have also prescribed Fluconazole to take once antibiotic completed. If symptoms of yeast start in the middle of dosing, use OTC monistat to keep symptoms mild, then take the fluconazole once completed the antibiotic to eradicate.   Urinary tract infections can be prevented by drinking plenty of water to keep your body hydrated.  Also be sure when you wipe, wipe from front to back and don't hold it in!  If possible, empty your bladder every 4 hours.  HOME CARE Drink plenty of fluids Compete the full course of the antibiotics even if the symptoms resolve Remember, when you need to go.go. Holding in your urine can increase the likelihood of getting a UTI! GET HELP RIGHT AWAY IF: You cannot urinate You get a high fever Worsening back pain occurs You see blood in your urine You feel sick to your stomach or throw up You feel like you are going to pass out  MAKE SURE YOU  Understand these instructions. Will watch your condition. Will get help right away if you are not doing well or get worse.   Thank you for choosing an e-visit.  Your e-visit answers were reviewed by a board certified advanced clinical practitioner to complete your personal care plan. Depending upon the condition,  your plan could have included both over the counter or prescription medications.  Please review your pharmacy choice. Make sure the pharmacy is open so you can pick up prescription now. If there is a problem, you may contact your provider through [employer] and have the prescription routed to another pharmacy.  Your safety is important to [place]. If you have drug allergies check your prescription carefully.   For the next 24 hours you can use MyChart to ask questions about today's visit, request a non-urgent call back, or ask for a work or school excuse. You will get an email in the next two days asking about your experience. I hope that your e-visit has been valuable and will speed your recovery.  I have spent 5 minutes in review of e-visit questionnaire, review and updating patient chart, medical decision making and response to patient.   Margaretann Loveless, PA-C

## 2022-05-26 IMAGING — CR DG SACRUM/COCCYX 2+V
3 series · 3 of 3 positions shown · non-contrast
Comparison: October 11, 2016

CLINICAL DATA: Coccydynia

EXAM:
SACRUM AND COCCYX - 2+ VIEW

[t sacrum ap]
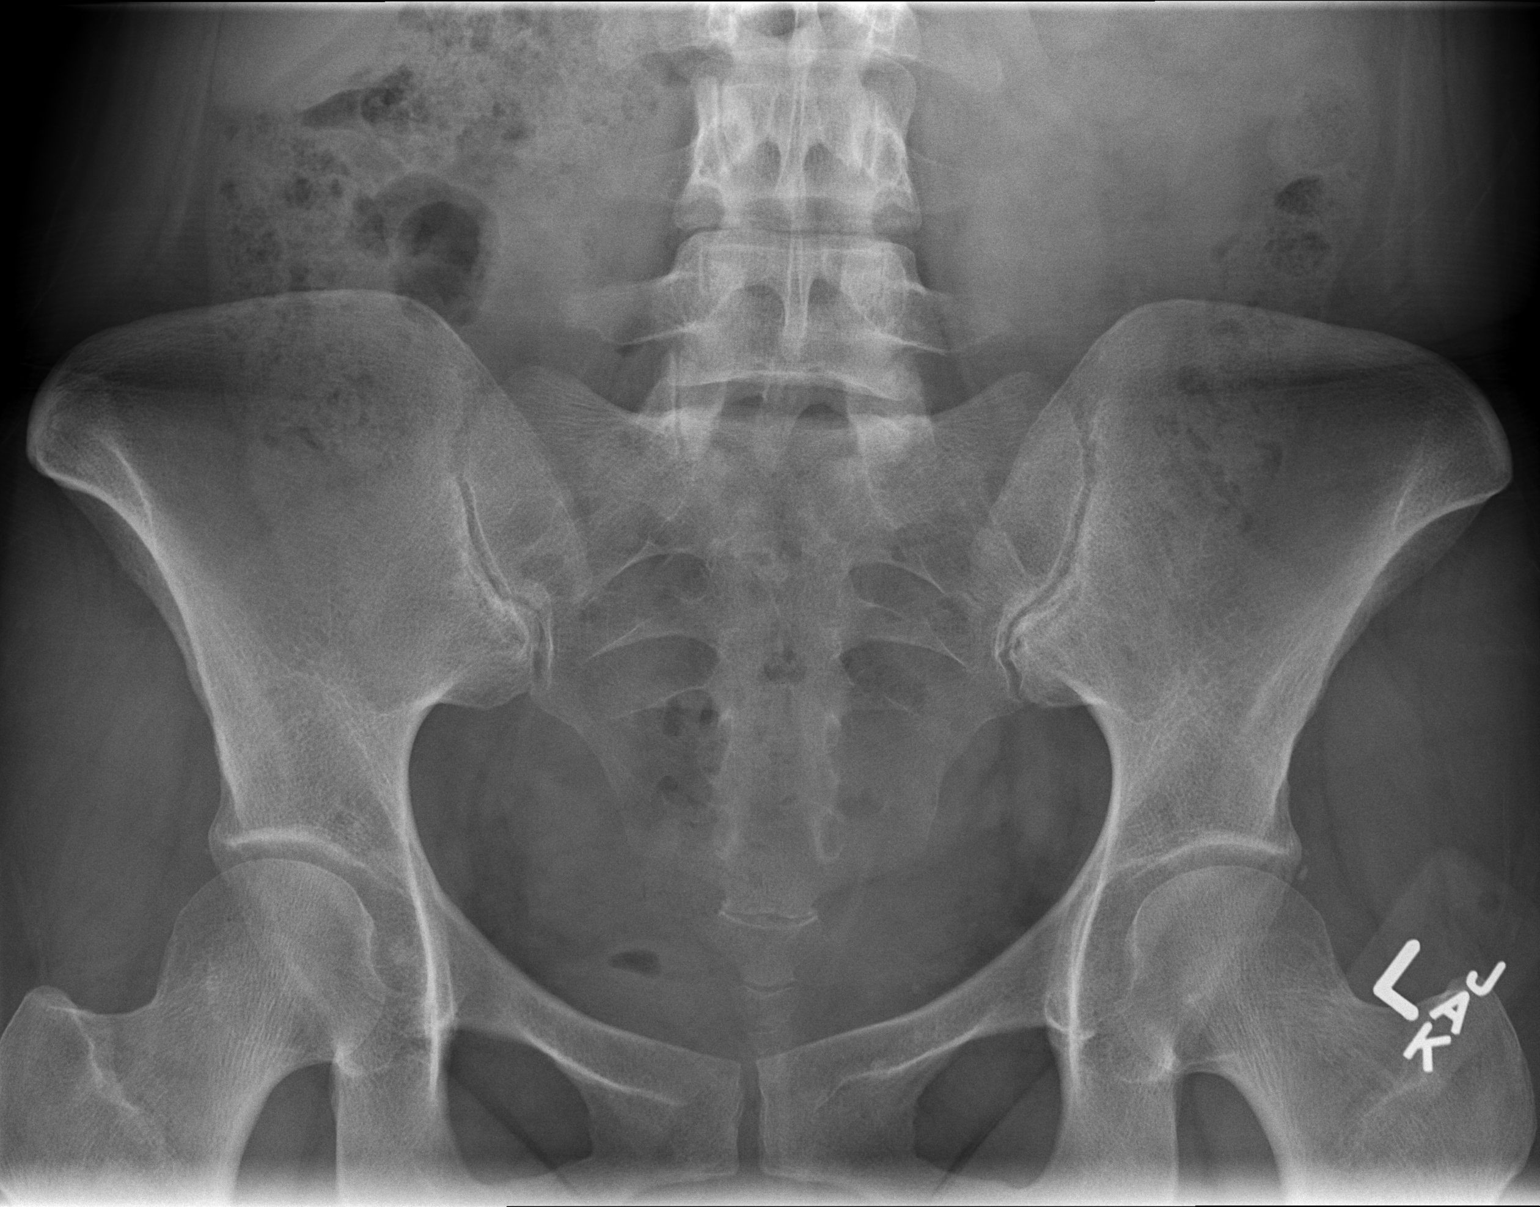

[t coccyx ap]
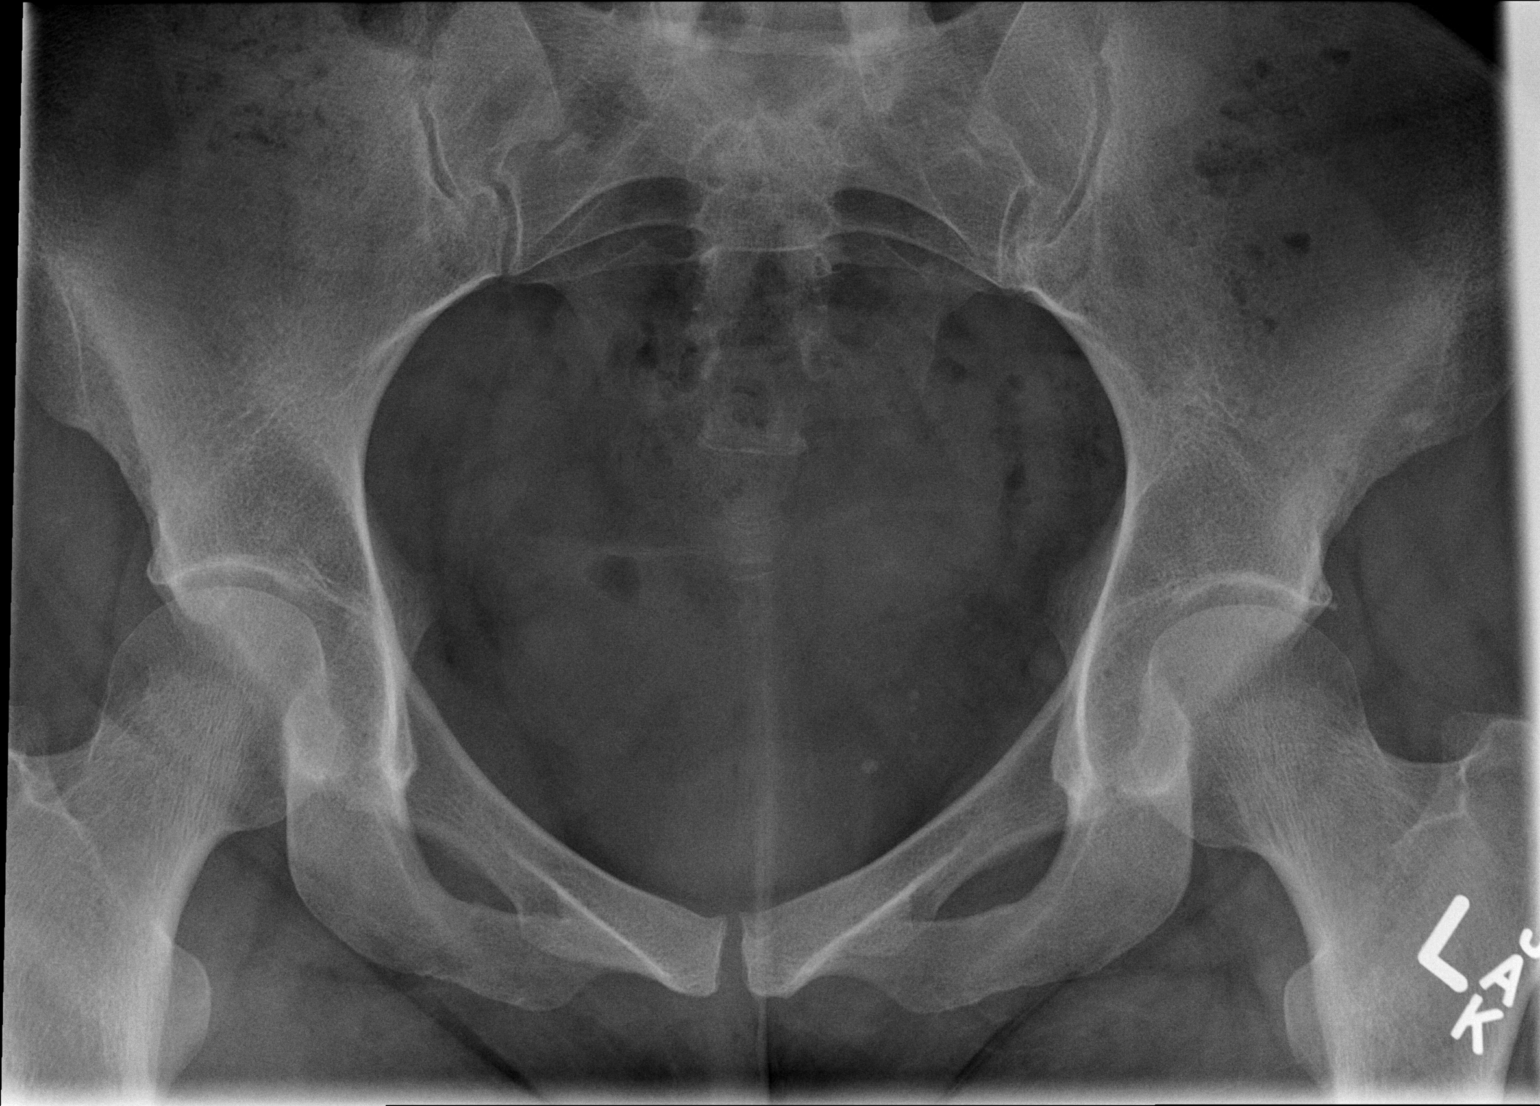

[t sacrum coccyx lat]
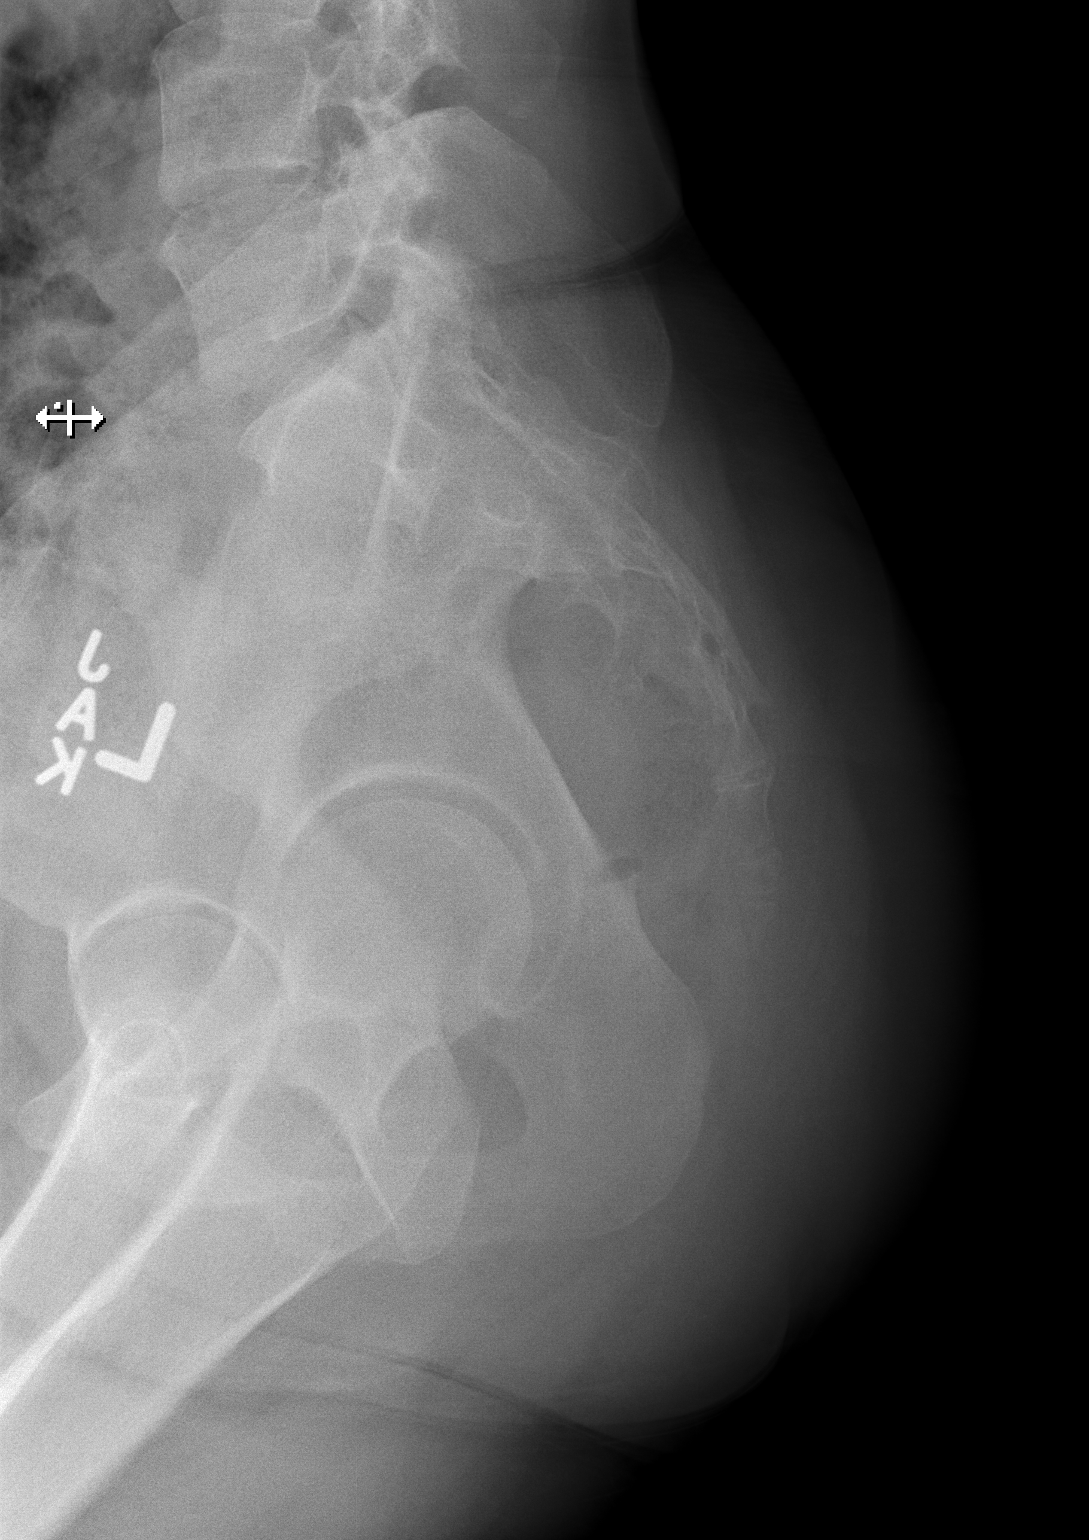

[3 of 3 positions shown; findings below may reference images not displayed]

FINDINGS: Frontal, angled frontal, and lateral views were obtained. No evident
fracture or diastasis. Joint spaces appear normal. No erosive
change.
IMPRESSION: No fracture or diastasis.  No evident arthropathy.

## 2022-11-01 ENCOUNTER — Telehealth: Payer: Commercial Managed Care - PPO | Admitting: Physician Assistant

## 2022-11-01 ENCOUNTER — Other Ambulatory Visit (HOSPITAL_COMMUNITY): Payer: Self-pay

## 2022-11-01 DIAGNOSIS — R3989 Other symptoms and signs involving the genitourinary system: Secondary | ICD-10-CM

## 2022-11-01 MED ORDER — FLUCONAZOLE 150 MG PO TABS
ORAL_TABLET | ORAL | 0 refills | Status: DC
  Filled 2022-11-01: qty 2, 3d supply, fill #0

## 2022-11-01 MED ORDER — CEPHALEXIN 500 MG PO CAPS
500.0000 mg | ORAL_CAPSULE | Freq: Two times a day (BID) | ORAL | 0 refills | Status: AC
  Filled 2022-11-01: qty 14, 7d supply, fill #0

## 2022-11-01 NOTE — Progress Notes (Signed)
I have spent 5 minutes in review of e-visit questionnaire, review and updating patient chart, medical decision making and response to patient.   William Cody Martin, PA-C    

## 2022-11-01 NOTE — Progress Notes (Signed)
E-Visit for Urinary Problems  We are sorry that you are not feeling well.  Here is how we plan to help!  Based on what you shared with me it looks like you most likely have a simple urinary tract infection.  A UTI (Urinary Tract Infection) is a bacterial infection of the bladder.  Most cases of urinary tract infections are simple to treat but a key part of your care is to encourage you to drink plenty of fluids and watch your symptoms carefully.  I have prescribed Keflex 500 mg twice a day for 7 days.  Your symptoms should gradually improve. Call us if the burning in your urine worsens, you develop worsening fever, back pain or pelvic pain or if your symptoms do not resolve after completing the antibiotic.  I have sent in a course of Diflucan in case needed for yeast from antibiotic use.   Urinary tract infections can be prevented by drinking plenty of water to keep your body hydrated.  Also be sure when you wipe, wipe from front to back and don't hold it in!  If possible, empty your bladder every 4 hours.  HOME CARE Drink plenty of fluids Compete the full course of the antibiotics even if the symptoms resolve Remember, when you need to go.go. Holding in your urine can increase the likelihood of getting a UTI! GET HELP RIGHT AWAY IF: You cannot urinate You get a high fever Worsening back pain occurs You see blood in your urine You feel sick to your stomach or throw up You feel like you are going to pass out  MAKE SURE YOU  Understand these instructions. Will watch your condition. Will get help right away if you are not doing well or get worse.   Thank you for choosing an e-visit.  Your e-visit answers were reviewed by a board certified advanced clinical practitioner to complete your personal care plan. Depending upon the condition, your plan could have included both over the counter or prescription medications.  Please review your pharmacy choice. Make sure the pharmacy is open  so you can pick up prescription now. If there is a problem, you may contact your provider through [employer] and have the prescription routed to another pharmacy.  Your safety is important to [place]. If you have drug allergies check your prescription carefully.   For the next 24 hours you can use MyChart to ask questions about today's visit, request a non-urgent call back, or ask for a work or school excuse. You will get an email in the next two days asking about your experience. I hope that your e-visit has been valuable and will speed your recovery.

## 2022-11-02 ENCOUNTER — Other Ambulatory Visit (HOSPITAL_COMMUNITY): Payer: Self-pay

## 2023-01-21 ENCOUNTER — Telehealth: Payer: Commercial Managed Care - PPO | Admitting: Physician Assistant

## 2023-01-21 ENCOUNTER — Telehealth: Payer: Commercial Managed Care - PPO | Admitting: Nurse Practitioner

## 2023-01-21 DIAGNOSIS — R3989 Other symptoms and signs involving the genitourinary system: Secondary | ICD-10-CM

## 2023-01-21 DIAGNOSIS — B379 Candidiasis, unspecified: Secondary | ICD-10-CM

## 2023-01-21 MED ORDER — FLUCONAZOLE 150 MG PO TABS: 150.0000 mg | ORAL_TABLET | Freq: Once | ORAL | 0 refills | Status: AC

## 2023-01-21 MED ORDER — NITROFURANTOIN MONOHYD MACRO 100 MG PO CAPS: 100.0000 mg | ORAL_CAPSULE | Freq: Two times a day (BID) | ORAL | 0 refills | Status: DC

## 2023-01-21 NOTE — Progress Notes (Signed)
E-Visit for Urinary Problems  We are sorry that you are not feeling well.  Here is how we plan to help!  Based on what you shared with me it looks like you most likely have a simple urinary tract infection.  A UTI (Urinary Tract Infection) is a bacterial infection of the bladder.  Most cases of urinary tract infections are simple to treat but a key part of your care is to encourage you to drink plenty of fluids and watch your symptoms carefully.  I have prescribed MacroBid 100 mg twice a day for 5 days.  Your symptoms should gradually improve. Call us if the burning in your urine worsens, you develop worsening fever, back pain or pelvic pain or if your symptoms do not resolve after completing the antibiotic.  Urinary tract infections can be prevented by drinking plenty of water to keep your body hydrated.  Also be sure when you wipe, wipe from front to back and don't hold it in!  If possible, empty your bladder every 4 hours.  HOME CARE Drink plenty of fluids Compete the full course of the antibiotics even if the symptoms resolve Remember, when you need to go.go. Holding in your urine can increase the likelihood of getting a UTI! GET HELP RIGHT AWAY IF: You cannot urinate You get a high fever Worsening back pain occurs You see blood in your urine You feel sick to your stomach or throw up You feel like you are going to pass out  MAKE SURE YOU  Understand these instructions. Will watch your condition. Will get help right away if you are not doing well or get worse.   Thank you for choosing an e-visit.  Your e-visit answers were reviewed by a board certified advanced clinical practitioner to complete your personal care plan. Depending upon the condition, your plan could have included both over the counter or prescription medications.  Please review your pharmacy choice. Make sure the pharmacy is open so you can pick up prescription now. If there is a problem, you may contact your  provider through MyChart messaging and have the prescription routed to another pharmacy.  Your safety is important to us. If you have drug allergies check your prescription carefully.   For the next 24 hours you can use MyChart to ask questions about today's visit, request a non-urgent call back, or ask for a work or school excuse. You will get an email in the next two days asking about your experience. I hope that your e-visit has been valuable and will speed your recovery.  I have spent 5 minutes in review of e-visit questionnaire, review and updating patient chart, medical decision making and response to patient.   Yavier Snider M Heberto Sturdevant, PA-C  

## 2023-01-21 NOTE — Progress Notes (Signed)
[name],   No problem we will send over a Diflucan as well to be used as needed if yeast symptoms develop while on the antibiotics.        Current Outpatient Medications:    fluconazole (DIFLUCAN) 150 MG tablet, Take 1 tablet (150 mg total) by mouth once for 1 dose., Disp: 1 tablet, Rfl: 0   nitrofurantoin, macrocrystal-monohydrate, (MACROBID) 100 MG capsule, Take 1 capsule (100 mg total) by mouth 2 (two) times daily., Disp: 10 capsule, Rfl: 0  All the best Tiffany Summers   No charge patient was seen previously today -   I spent approximately 5 minutes reviewing the patient's history, current symptoms and coordinating their care today.

## 2023-10-02 ENCOUNTER — Telehealth: Admitting: Physician Assistant

## 2023-10-02 DIAGNOSIS — H1031 Unspecified acute conjunctivitis, right eye: Secondary | ICD-10-CM | POA: Diagnosis not present

## 2023-10-02 MED ORDER — POLYMYXIN B-TRIMETHOPRIM 10000-0.1 UNIT/ML-% OP SOLN: OPHTHALMIC | 0 refills | Status: AC

## 2023-10-02 NOTE — Progress Notes (Signed)

## 2023-10-02 NOTE — Progress Notes (Signed)
 I have spent 5 minutes in review of e-visit questionnaire, review and updating patient chart, medical decision making and response to patient.   Piedad Climes, PA-C

## 2023-10-03 ENCOUNTER — Telehealth

## 2023-10-03 DIAGNOSIS — B3731 Acute candidiasis of vulva and vagina: Secondary | ICD-10-CM

## 2023-10-04 MED ORDER — FLUCONAZOLE 150 MG PO TABS: 150.0000 mg | ORAL_TABLET | ORAL | 0 refills | Status: AC | PRN

## 2023-10-04 NOTE — Progress Notes (Signed)

## 2024-02-21 ENCOUNTER — Other Ambulatory Visit (HOSPITAL_COMMUNITY): Payer: Self-pay

## 2024-03-26 DIAGNOSIS — Z0100 Encounter for examination of eyes and vision without abnormal findings: Secondary | ICD-10-CM | POA: Diagnosis not present

## 2024-03-26 DIAGNOSIS — H52223 Regular astigmatism, bilateral: Secondary | ICD-10-CM | POA: Diagnosis not present
